# Patient Record
Sex: Male | Born: 1980 | Race: Black or African American | Hispanic: No | Smoking: Never smoker
Health system: Southern US, Community
[De-identification: ages and names within clinical notes are randomized; demographics above are authoritative.]

## PROBLEM LIST (undated history)

## (undated) DIAGNOSIS — I10 Essential (primary) hypertension: Secondary | ICD-10-CM

## (undated) DIAGNOSIS — I4891 Unspecified atrial fibrillation: Secondary | ICD-10-CM

## (undated) HISTORY — DX: Essential (primary) hypertension: I10

## (undated) HISTORY — DX: Unspecified atrial fibrillation: I48.91

---
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
## 2001-12-18 ENCOUNTER — Encounter: Admission: RE | Admit: 2001-12-18 | Discharge: 2001-12-18 | Payer: Self-pay | Admitting: Family Medicine

## 2002-08-23 ENCOUNTER — Encounter: Admission: RE | Admit: 2002-08-23 | Discharge: 2002-08-23 | Payer: Self-pay | Admitting: Family Medicine

## 2002-08-23 ENCOUNTER — Encounter: Admission: RE | Admit: 2002-08-23 | Discharge: 2002-08-23 | Payer: Self-pay | Admitting: Sports Medicine

## 2002-08-23 ENCOUNTER — Encounter: Payer: Self-pay | Admitting: Sports Medicine

## 2004-06-26 ENCOUNTER — Emergency Department (HOSPITAL_COMMUNITY): Admission: EM | Admit: 2004-06-26 | Discharge: 2004-06-26 | Payer: Self-pay | Admitting: Family Medicine

## 2005-01-17 ENCOUNTER — Ambulatory Visit: Payer: Self-pay | Admitting: Family Medicine

## 2007-06-19 ENCOUNTER — Ambulatory Visit: Payer: Self-pay | Admitting: Family Medicine

## 2007-06-19 ENCOUNTER — Encounter: Payer: Self-pay | Admitting: *Deleted

## 2007-06-21 ENCOUNTER — Ambulatory Visit: Payer: Self-pay | Admitting: Sports Medicine

## 2007-11-11 ENCOUNTER — Emergency Department (HOSPITAL_COMMUNITY): Admission: EM | Admit: 2007-11-11 | Discharge: 2007-11-11 | Payer: Self-pay | Admitting: Family Medicine

## 2007-11-14 ENCOUNTER — Ambulatory Visit: Payer: Self-pay | Admitting: Family Medicine

## 2007-11-14 ENCOUNTER — Encounter (INDEPENDENT_AMBULATORY_CARE_PROVIDER_SITE_OTHER): Payer: Self-pay | Admitting: Family Medicine

## 2007-11-14 DIAGNOSIS — L988 Other specified disorders of the skin and subcutaneous tissue: Secondary | ICD-10-CM | POA: Insufficient documentation

## 2007-11-16 ENCOUNTER — Encounter: Payer: Self-pay | Admitting: *Deleted

## 2008-01-04 ENCOUNTER — Ambulatory Visit: Payer: Self-pay | Admitting: Family Medicine

## 2008-01-11 ENCOUNTER — Telehealth: Payer: Self-pay | Admitting: *Deleted

## 2008-02-17 ENCOUNTER — Emergency Department (HOSPITAL_COMMUNITY): Admission: EM | Admit: 2008-02-17 | Discharge: 2008-02-17 | Payer: Self-pay | Admitting: Emergency Medicine

## 2008-06-18 ENCOUNTER — Ambulatory Visit: Payer: Self-pay | Admitting: Family Medicine

## 2008-06-20 ENCOUNTER — Ambulatory Visit: Payer: Self-pay | Admitting: Family Medicine

## 2009-03-31 ENCOUNTER — Encounter: Payer: Self-pay | Admitting: Family Medicine

## 2009-03-31 ENCOUNTER — Inpatient Hospital Stay (HOSPITAL_COMMUNITY): Admission: EM | Admit: 2009-03-31 | Discharge: 2009-04-01 | Payer: Self-pay | Admitting: Emergency Medicine

## 2009-03-31 ENCOUNTER — Ambulatory Visit: Payer: Self-pay | Admitting: Family Medicine

## 2009-04-01 ENCOUNTER — Encounter: Payer: Self-pay | Admitting: Family Medicine

## 2009-04-14 ENCOUNTER — Ambulatory Visit: Payer: Self-pay | Admitting: Family Medicine

## 2009-04-14 DIAGNOSIS — I4891 Unspecified atrial fibrillation: Secondary | ICD-10-CM

## 2009-04-21 ENCOUNTER — Telehealth: Payer: Self-pay | Admitting: Family Medicine

## 2009-04-21 ENCOUNTER — Emergency Department (HOSPITAL_COMMUNITY): Admission: EM | Admit: 2009-04-21 | Discharge: 2009-04-21 | Payer: Self-pay | Admitting: *Deleted

## 2009-05-07 ENCOUNTER — Ambulatory Visit: Payer: Self-pay | Admitting: Family Medicine

## 2009-05-07 ENCOUNTER — Ambulatory Visit (HOSPITAL_COMMUNITY): Admission: RE | Admit: 2009-05-07 | Discharge: 2009-05-07 | Payer: Self-pay | Admitting: Family Medicine

## 2009-05-07 DIAGNOSIS — Z8679 Personal history of other diseases of the circulatory system: Secondary | ICD-10-CM | POA: Insufficient documentation

## 2009-05-07 DIAGNOSIS — I10 Essential (primary) hypertension: Secondary | ICD-10-CM

## 2009-05-07 DIAGNOSIS — G510 Bell's palsy: Secondary | ICD-10-CM | POA: Insufficient documentation

## 2009-05-08 ENCOUNTER — Encounter: Payer: Self-pay | Admitting: Family Medicine

## 2009-05-21 ENCOUNTER — Ambulatory Visit: Payer: Self-pay | Admitting: Family Medicine

## 2009-05-22 ENCOUNTER — Telehealth: Payer: Self-pay | Admitting: Family Medicine

## 2009-06-15 ENCOUNTER — Encounter: Payer: Self-pay | Admitting: Family Medicine

## 2009-10-21 ENCOUNTER — Encounter: Payer: Self-pay | Admitting: Family Medicine

## 2009-10-26 ENCOUNTER — Ambulatory Visit: Payer: Self-pay | Admitting: Family Medicine

## 2009-10-28 ENCOUNTER — Ambulatory Visit: Payer: Self-pay | Admitting: Family Medicine

## 2009-11-12 ENCOUNTER — Ambulatory Visit: Payer: Self-pay | Admitting: Family Medicine

## 2009-11-12 DIAGNOSIS — E669 Obesity, unspecified: Secondary | ICD-10-CM

## 2009-11-16 ENCOUNTER — Ambulatory Visit: Payer: Self-pay | Admitting: Family Medicine

## 2009-11-16 ENCOUNTER — Encounter: Payer: Self-pay | Admitting: Family Medicine

## 2009-11-16 LAB — CONVERTED CEMR LAB
ALT: 32 units/L (ref 0–53)
AST: 27 units/L (ref 0–37)
Albumin: 4.7 g/dL (ref 3.5–5.2)
Alkaline Phosphatase: 39 units/L (ref 39–117)
BUN: 10 mg/dL (ref 6–23)
CO2: 26 meq/L (ref 19–32)
Calcium: 9.3 mg/dL (ref 8.4–10.5)
Chlamydia, Swab/Urine, PCR: POSITIVE — AB
Chloride: 102 meq/L (ref 96–112)
Cholesterol: 201 mg/dL — ABNORMAL HIGH (ref 0–200)
Creatinine, Ser: 0.96 mg/dL (ref 0.40–1.50)
GC Probe Amp, Urine: NEGATIVE
Glucose, Bld: 92 mg/dL (ref 70–99)
HCT: 47.4 % (ref 39.0–52.0)
HCV Ab: NEGATIVE
HDL: 32 mg/dL — ABNORMAL LOW (ref >39)
Hemoglobin: 15.3 g/dL (ref 13.0–17.0)
Hep B S Ab: NEGATIVE
Hepatitis B Surface Ag: NEGATIVE
LDL Cholesterol: 121 mg/dL — ABNORMAL HIGH (ref 0–99)
MCHC: 32.3 g/dL (ref 30.0–36.0)
MCV: 85.4 fL (ref 78.0–100.0)
Platelets: 224 10*3/uL (ref 150–400)
Potassium: 4.1 meq/L (ref 3.5–5.3)
RBC: 5.55 M/uL (ref 4.22–5.81)
RDW: 13 % (ref 11.5–15.5)
Sodium: 139 meq/L (ref 135–145)
Total Bilirubin: 0.6 mg/dL (ref 0.3–1.2)
Total CHOL/HDL Ratio: 6.3
Total Protein: 7.5 g/dL (ref 6.0–8.3)
Triglycerides: 241 mg/dL — ABNORMAL HIGH (ref <150)
VLDL: 48 mg/dL — ABNORMAL HIGH (ref 0–40)
WBC: 5.4 10*3/uL (ref 4.0–10.5)

## 2009-11-19 ENCOUNTER — Telehealth: Payer: Self-pay | Admitting: Family Medicine

## 2009-11-19 ENCOUNTER — Encounter: Payer: Self-pay | Admitting: Family Medicine

## 2009-11-19 ENCOUNTER — Telehealth: Payer: Self-pay | Admitting: *Deleted

## 2009-11-20 ENCOUNTER — Encounter: Payer: Self-pay | Admitting: *Deleted

## 2009-11-24 ENCOUNTER — Ambulatory Visit: Payer: Self-pay | Admitting: Family Medicine

## 2009-12-08 ENCOUNTER — Ambulatory Visit: Payer: Self-pay | Admitting: Family Medicine

## 2009-12-08 ENCOUNTER — Ambulatory Visit (HOSPITAL_COMMUNITY): Admission: RE | Admit: 2009-12-08 | Discharge: 2009-12-08 | Payer: Self-pay | Admitting: Family Medicine

## 2009-12-15 ENCOUNTER — Encounter: Payer: Self-pay | Admitting: Family Medicine

## 2009-12-16 ENCOUNTER — Encounter: Payer: Self-pay | Admitting: Family Medicine

## 2010-01-26 ENCOUNTER — Ambulatory Visit: Payer: Self-pay | Admitting: Family Medicine

## 2010-02-18 ENCOUNTER — Ambulatory Visit: Payer: Self-pay | Admitting: Family Medicine

## 2010-02-25 ENCOUNTER — Encounter: Payer: Self-pay | Admitting: *Deleted

## 2010-03-10 ENCOUNTER — Ambulatory Visit: Payer: Self-pay | Admitting: Cardiovascular Disease

## 2010-03-10 ENCOUNTER — Encounter: Payer: Self-pay | Admitting: Family Medicine

## 2010-05-11 NOTE — Letter (Signed)
Summary: Generic Letter  [facility]  [address]   [place], [place] [address]   Phone: [phone]  Fax: [phone]    11/20/2009  Keith Briggs [address] [place], [place]  [address]  Dear Keith Briggs,  I have tried to contact you by phone regarding your test results.  You have tested postive for Chlamydia. Please call and make an appointment to see a nurse so that you can be treated.  Thank you.         Sincerely,   Loralee Pacas CMA  Appended Document: Generic Letter Letter was not mailed (ERROR) faxed pos test results to [facility] left many msg with pt with no return call

## 2010-05-11 NOTE — Letter (Signed)
Summary: Lipid Letter  [facility]  [address]   [place], [place] [address]   Phone: [phone]  Fax: [phone]    11/19/2009  Makenzie Vittorio [address] [place], [place]  [address]  Dear Algis Liming:  We have carefully reviewed your last lipid profile from 11/16/2009 and the results are noted below with a summary of recommendations for lipid management.    Cholesterol:       201     Goal: <200   HDL "good" Cholesterol:   32     Goal: >40   LDL "bad" Cholesterol:   121     Goal: <160   Triglycerides:       241     Goal: <200   Intensify weight management, increase physical activity, and  smoking cessation.  These are the most important first steps to take.  Return in 3 months or sooner so we can discuss lifestyle changes and when and if medicine is right for you.  Your test for Chlamydia is positive.  Please call the office to come in for treatment.  Do not have sex until 7 days after treated and please inform any sexual partners to get tested.    TLC Diet (Therapeutic Lifestyle Change): Saturated Fats & Transfatty acids should be kept < 7% of total calories ***Reduce Saturated Fats Polyunstaurated Fat can be up to 10% of total calories Monounsaturated Fat Fat can be up to 20% of total calories Total Fat should be no greater than 25-35% of total calories Carbohydrates should be 50-60% of total calories Protein should be approximately 15% of total calories Fiber should be at least 20-30 grams a day ***Increased fiber may help lower LDL Total Cholesterol should be < 200mg /day Consider adding plant stanol/sterols to diet (example: Benacol spread) ***A higher intake of unsaturated fat may reduce Triglycerides and Increase HDL    Adjunctive Measures (may lower LIPIDS and reduce risk of Heart Attack) include: Aerobic Exercise (20-30 minutes 3-4 times a week) Limit Alcohol Consumption Weight Reduction Aspirin 75-81 mg a day by mouth (if not allergic or  contraindicated) Dietary Fiber 20-30 grams a day by mouth     Current Medications: 1)    Bayer Aspirin 325 Mg Tabs (Aspirin) .Marland Kitchen.. 1 tab by mouth daily 2)    Toprol Xl 100 Mg Xr24h-tab (Metoprolol succinate) .Marland Kitchen.. 1 tab by mouth daily for blood pressure and heart rhythm 3)    Hydrochlorothiazide 12.5 Mg Caps (Hydrochlorothiazide) .Marland Kitchen.. 1 tab by mouth daily for blood pressure  If you have any questions, please call. We appreciate being able to work with you.   Sincerely,    [facility] Delbert Harness MD  Appended Document: Lipid Letter MAILED LETTER TO PT

## 2010-05-11 NOTE — Miscellaneous (Signed)
Summary: Re:No urology referral.  Clinical Lists Changes   received notification from  [facility] that they are unable to complete the urology referral at this time due to lack of volunteer physicians is this speciality group. they will notify patient when they can process referral. Theresia Lo RN  December 16, 2009 2:29 PM

## 2010-05-11 NOTE — Consult Note (Signed)
Summary: [facility]  [facility]   Imported By: De Nurse 03/18/2010 16:07:28  _____________________________________________________________________  External Attachment:    Type:   Image     Comment:   External Document

## 2010-05-11 NOTE — Progress Notes (Signed)
  Phone Note Outgoing Call   Call placed by: Loralee Pacas CMA,  November 19, 2009 4:59 PM Call placed to: Patient Summary of Call: lvm for pt to call back Initial call taken by: Loralee Pacas CMA,  November 19, 2009 4:59 PM  Follow-up for Phone Call        lvm for pt to return call Follow-up by: Loralee Pacas CMA,  November 20, 2009 11:02 AM    sent letter to pt concerning results and the need for tx.Loralee Pacas CMA  November 20, 2009 11:42 AM
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
## 2010-05-11 NOTE — Assessment & Plan Note (Signed)
Summary: f/u/eo   Vital Signs:  Patient profile:   30 year old male Weight:      238.5 pounds Temp:     98.9 degrees F oral Pulse rate:   62 / minute Pulse rhythm:   regular BP sitting:   138 / 89  (left arm) Cuff size:   regular  Vitals Entered By: Loralee Pacas CMA (November 12, 2009 11:04 AM)  Primary Care Provider:  Delbert Harness MD   History of Present Illness: 30 yo here for follow-up.  HYPERTENSION Meds: Taking and tolerating? toprol xl 100 and hctz 25 Home BP's: no Chest Pain: no Dyspnea: no  a. fib:  no palpitations, lightheadedness, sob, syncope.  Has been taking meds as directed.  Reveiwed hospital discharge.  Obesity:  Is not doing any exercise or dietary modification.  Says he has increased physical activity in the last week and plans on walking more regularly.  Current Medications (verified): 1)  Bayer Aspirin 325 Mg Tabs (Aspirin) .Marland Kitchen.. 1 Tab By Mouth Daily 2)  Toprol Xl 100 Mg Xr24h-Tab (Metoprolol Succinate) .Marland Kitchen.. 1 Tab By Mouth Daily For Blood Pressure and Heart Rhythm 3)  Hydrochlorothiazide 12.5 Mg Caps (Hydrochlorothiazide) .Marland Kitchen.. 1 Tab By Mouth Daily For Blood Pressure  Allergies: 1)  ! * Bee Stings 2)  * Butter Milk PMH-FH-SH reviewed-no changes except otherwise noted  Family History: Bone CA - mat aunt, Diabetes - pat grandfather, Skin CA - mat aunt, rare ovarian CA in mother, Thyroid CA- mat grandmother Family History Hypertension Family History Ovarian cancer GRandma with DM  Review of Systems      See HPI  Physical Exam  General:  Well-developed,well-nourished,in no acute distress; alert,appropriate and cooperative throughout examination Eyes:  vision grossly intact, pupils equal, pupils round, and pupils reactive to light.   Neck:  supple, no masses, and no JVD.   Lungs:  Normal respiratory effort, chest expands symmetrically. Lungs are clear to auscultation, no crackles or wheezes. Heart:  Normal rate and regular rhythm. S1 and S2 normal  without gallop, murmur, click, rub or other extra sounds. Extremities:  no le edema Neurologic:  alert & oriented X3.   Psych:  Oriented X3, memory intact for recent and remote, normally interactive, good eye contact, not anxious appearing, and not depressed appearing.     Impression & Recommendations:  Problem # 1:  HYPERTENSION, BENIGN ESSENTIAL (ICD-401.1)  Borderline today.  patient agreeable to checking BP's at home periodically and to return if above 140/90.  Will copntinue current meds.  His updated medication list for this problem includes:    Toprol Xl 100 Mg Xr24h-tab (Metoprolol succinate) .Marland Kitchen... 1 tab by mouth daily for blood pressure and heart rhythm    Hydrochlorothiazide 12.5 Mg Caps (Hydrochlorothiazide) .Marland Kitchen... 1 tab by mouth daily for blood pressure  Orders: [facility]- Est  Level 4 (99214)Future Orders: CBC-[facility] ([id number]) ... 11/16/2009 Comp Met-[facility] ([id number]) ... 11/16/2009 Lipid-[facility] ([id number]) ... 11/16/2009  BP today: 138/89 Prior BP: 140/72 (05/07/2009)  Problem # 2:  ATRIAL FIBRILLATION, PAROXYSMAL (ICD-427.31) Continue rate control.  CHADS2 score of 1.  No coumadin.  His updated medication list for this problem includes:    Bayer Aspirin 325 Mg Tabs (Aspirin) .Marland Kitchen... 1 tab by mouth daily    Toprol Xl 100 Mg Xr24h-tab (Metoprolol succinate) .Marland Kitchen... 1 tab by mouth daily for blood pressure and heart rhythm  Orders: [facility]- Est  Level 4 (99214)Future Orders: CBC-[facility] (57846) ... 11/16/2009 Comp Met-[facility] ([id number]) ... 11/16/2009  Problem # 3:  OBESITY, UNSPECIFIED (ICD-278.00) Patient would like to make changes in diet.  He expressed interest in nutritional consult today.  I gave him diethistory and 3 day diet log to bring to consult with Dr. Gerilyn Pilgrim for obesity and hypertension.  Discussed BMI and its role on health.  Will f/u at next visit  Orders: [facility]- Est  Level 4 (99214)Future Orders: CBC-[facility] (19147) ... 11/16/2009 Lipid-[facility] ([id number]) ...  11/16/2009  Problem # 4:  SEXUALLY TRANSMITTED DISEASE, EXPOSURE TO (ICD-V01.6) patient desires screening for STD's/  Orders: [facility]- Est  Level 4 (99214)Future Orders: HIV-[facility] ([id number]) ... 11/16/2009 Hep C Ab-[facility] ([id number]) ... 11/16/2009 Hep Bs Ab-[facility] ([id number]) ... 11/16/2009 GC/Chlamydia-[facility] ([id number]) ... 11/16/2009 Hep Bs Ag-[facility] ([id number]) ... 11/16/2009 RPR-[facility] [id number]) ... 11/16/2009  Complete Medication List: 1)  Bayer Aspirin 325 Mg Tabs (Aspirin) .Marland Kitchen.. 1 tab by mouth daily 2)  Toprol Xl 100 Mg Xr24h-tab (Metoprolol succinate) .Marland Kitchen.. 1 tab by mouth daily for blood pressure and heart rhythm 3)  Hydrochlorothiazide 12.5 Mg Caps (Hydrochlorothiazide) .Marland Kitchen.. 1 tab by mouth daily for blood pressure  Patient Instructions: 1)  Make lab appointment for fasting labs- no food or drink in 8 hours 2)  Will check cholesterol, fasting blood sugar, HIV, syphillis, gonorrhea, chlamydia 3)  Make appt to see Dr. Gerilyn Pilgrim for nutrition counseling 4)  Follow-up in 3 months or sooner if needed.   Prevention & Chronic Care Immunizations   Influenza vaccine: Fluvax Non-MCR  (01/04/2008)    Tetanus booster: 01/14/2005: Done.    Pneumococcal vaccine: Not documented  Other Screening   Smoking status: never  (03/31/2009)  Hypertension   Last Blood Pressure: 138 / 89  (11/12/2009)   Serum creatinine: Not documented   Serum potassium Not documented CMP ordered     Hypertension flowsheet reviewed?: Yes   Progress toward BP goal: At goal  Self-Management Support :   Personal Goals (by the next clinic visit) :      Personal blood pressure goal: 140/90  (11/12/2009)   Patient will work on the following items until the next clinic visit to reach self-care goals:     Medications and monitoring: take my medicines every day, check my blood pressure  (11/12/2009)    Hypertension self-management support: Pre-printed educational material  (11/12/2009)

## 2010-05-11 NOTE — Miscellaneous (Signed)
Summary: [facility]  ROI   Imported By: De Nurse 12/11/2009 11:46:20  _____________________________________________________________________  External Attachment:    Type:   Image     Comment:   External Document

## 2010-05-11 NOTE — Progress Notes (Signed)
Summary: Rx Ques  Phone Note Call from Patient Call back at [facility] Phone [phone]   Caller: Patient Summary of Call: Pt calling back about new rx for bp. Initial call taken by: Clydell Hakim,  May 22, 2009 4:41 PM  Follow-up for Phone Call        will start hctz.  asked pt to call for nurse visit for bp check and lab visit in 1-2 weeks to check K+.  pt expressed understanding.  Follow-up by: Asher Muir MD,  May 22, 2009 5:03 PM

## 2010-05-11 NOTE — Letter (Signed)
Summary: Generic Letter  [facility]  [address]   [place], [place] [address]   Phone: [phone]  Fax: [phone]    02/25/2010  ADEN SEK [address] [place], [place]  [address]  Dear Mr. Bruster,  I have scheduled an appointment for you on March 10, 2010 at 1130am with Dr. Elease Hashimoto at [facility]. The office is located at [address] [place] Ph. [phone].  If you cannot keep this appointment please call thier office at least 24 hours  in advance to cancel or reschedule.   Sincerely,   Loralee Pacas CMA

## 2010-05-11 NOTE — Assessment & Plan Note (Signed)
Summary: FLU SHOT/[facility]  Nurse Visit   Vital Signs:  Patient profile:   30 year old male Temp:     98.2 degrees F  Vitals Entered By: Theresia Lo RN (January 26, 2010 4:10 PM)  Allergies: 1)  ! * Bee Stings 2)  * Butter Milk  Immunizations Administered:  Influenza Vaccine # 1:    Vaccine Type: Fluvax 3+    Site: right deltoid    Mfr: GlaxoSmithKline    Dose: 0.5 ml    Route: IM    Given by: Theresia Lo RN    Exp. Date: 10/06/2010    Lot #: [id number]    VIS given: 11/03/09 version given January 26, 2010.  Orders Added: 1)  Flu Vaccine 25yrs + [90658] 2)  Admin 1st Vaccine [90471]  Appended Document: FLU SHOT/[facility]   Flu Vaccine Consent Questions    Do you have a history of severe allergic reactions to this vaccine? no    Any prior history of allergic reactions to egg and/or gelatin? no    Do you have a sensitivity to the preservative Thimersol? no    Do you have a past history of Guillan-Barre Syndrome? no    Do you currently have an acute febrile illness? no    Have you ever had a severe reaction to latex? no    Vaccine information given and explained to patient? yes

## 2010-05-11 NOTE — Assessment & Plan Note (Signed)
Summary: Chest pain/tlb   Vital Signs:  Patient profile:   30 year old male Weight:      239.6 pounds Temp:     98.8 degrees F oral Pulse rate:   62 / minute Pulse rhythm:   regular BP sitting:   126 / 81  (right arm) Cuff size:   large  Vitals Entered By: Loralee Pacas CMA (December 08, 2009 2:19 PM) CC: wart removal   Primary Care Provider:  Delbert Harness MD  CC:  wart removal.  History of Present Illness: 30 yo with history of paroxysmal A. fib here for appt for wart.  His mom is concerned about recurrent episodes on chest pain.  4 months duration.  Described as sharp "like a pin is poking me" and usually occurs on exertion but can also occur at rest.  Associated with some SOB but no nausea or diaphoresis.  pain does not radiate.  Also can feel fast irregular heartbeat at times but not always associated with episodes of chest pain.  States it is hard to quantify severity of pain as he is used to "dealing with pain"  Non smoker, no fam history of MI,CVA.  Habits & Providers  Alcohol-Tobacco-Diet     Alcohol drinks/day: 0     Tobacco Status: never  Allergies: 1)  ! * Bee Stings 2)  * Butter Milk PMH-FH-SH reviewed for relevance  Review of Systems      See HPI  Physical Exam  General:  Well-developed,well-nourished,in no acute distress; alert,appropriate and cooperative throughout examination Lungs:  Normal respiratory effort, chest expands symmetrically. Lungs are clear to auscultation, no crackles or wheezes. Heart:  Normal rate and regular rhythm. S1 and S2 normal without gallop, murmur, click, rub or other extra sounds. Additional Exam:  EKG: rate 56, irr irr   Impression & Recommendations:  Problem # 1:  CHEST PAIN, ATYPICAL (ICD-786.59)  Concern for chest pain and SOB limiting exercise tolerance in otherwise healthy young man who is active. May benefit from decreased BB due to today's bradycardia but he also endorses episodes of tachycardia.   WIl send for  exercise stress test: discussed with Dr. Jennette Kettle.  Goal is to possibly reproduce and characterize symptoms with exercise and monitor rhythm at that time.  Wil continue Bb at current dose for now.  Orders: ETT (ETT)  Problem # 2:  FAMILY PLANNING (ICD-V25.09)  patient strongly desires permanent sterility.  Has no children.  He states he has done a great deal of thinking and reading and would like urology referral.  Will place referral so patient may discuss pocedure further with urology.    Orders: Urology Referral (Urology)  Complete Medication List: 1)  Bayer Aspirin 325 Mg Tabs (Aspirin) .Marland Kitchen.. 1 tab by mouth daily 2)  Toprol Xl 100 Mg Xr24h-tab (Metoprolol succinate) .Marland Kitchen.. 1 tab by mouth daily for blood pressure and heart rhythm 3)  Hydrochlorothiazide 12.5 Mg Caps (Hydrochlorothiazide) .Marland Kitchen.. 1 tab by mouth daily for blood pressure  Other Orders: EKG- [facility] (EKG)  Patient Instructions: 1)  Will call you with appointment for stress testing for your heart 2)  Will make referral for urology to talk about vasectomy. 3)  Will follow-up with your finger at next appointment. 4)  make follow-up after your stress test appointment.  Appended Document: Chest pain/tlb    Clinical Lists Changes  Orders: Added new Test order of [facility]- Est  Level 4 (76160) - Signed      Appended Document: Orders Update  Clinical Lists Changes  Orders: Added new Referral order of Cardiology Referral (Cardiology) - Signed

## 2010-05-11 NOTE — Progress Notes (Signed)
  Phone Note Outgoing Call   Summary of Call: Attempted to call patient to let him know positive chlamydia results.  Will need to come in for azithromycine slurry 1 gm x 1.  If he calls back please inform him-I will send him a letter.  Please attempt to call patient again.  Thanks Initial call taken by: Delbert Harness MD,  November 19, 2009 12:29 PM     Appended Document:  attempted to call pt lvm for pt to rtn call

## 2010-05-11 NOTE — Assessment & Plan Note (Signed)
Summary: FU BP/KH   Vital Signs:  Patient profile:   30 year old male Weight:      235.9 pounds Temp:     98.2 degrees F oral Pulse rate:   72 / minute Pulse rhythm:   regular BP sitting:   140 / 72 Cuff size:   large  Vitals Entered By: Loralee Pacas CMA (May 07, 2009 10:44 AM)  Primary Care Provider:  Asher Muir MD  CC:  afib, htn, and bells palsy.  History of Present Illness: 1.  afib--had one episode of fast heart rate since last visit.  occurred during exercise and resolved spontaneously after a few mintues.   tolerating metopolol well.    2.  htn--140/72 today, but states has been in 160s at home several times.    3.  bells palsy--had episode of face drooping several weeks ago.  went to ER.  diagnosed with bells palsy.  symptoms are almost resolved.  has mild assymettry when he smiles, but otherwise back to normal.    Current Medications (verified): 1)  Bayer Aspirin 325 Mg Tabs (Aspirin) .Marland Kitchen.. 1 Tab By Mouth Daily 2)  Metoprolol Succinate 50 Mg Xr24h-Tab (Metoprolol Succinate) .Marland Kitchen.. 1 Tab By Mouth Daily For Irregular Heart Beat  Allergies: 1)  ! * Bee Stings 2)  * Butter Milk  Physical Exam  General:  Well-developed,well-nourished,in no acute distress; alert,appropriate and cooperative throughout examination Lungs:  Normal respiratory effort, chest expands symmetrically. Lungs are clear to auscultation, no crackles or wheezes. Heart:  Normal rate and regular rhythm. S1 and S2 normal without gallop, murmur, click, rub or other extra sounds. Additional Exam:  vital signs reviewed    Impression & Recommendations:  Problem # 1:  ATRIAL FIBRILLATION, PAROXYSMAL (ICD-427.31) Assessment Unchanged EKG today shows normal sinus rhythm.  but still with periodic episodes.  increase metop to 100.  rtc for nurse visit in 2 weeks for bp and pulse check His updated medication list for this problem includes:    Bayer Aspirin 325 Mg Tabs (Aspirin) .Marland Kitchen... 1 tab by mouth  daily    Metoprolol Succinate 50 Mg Xr24h-tab (Metoprolol succinate) .Marland Kitchen... 1 tab by mouth daily for irregular heart beat    Toprol Xl 100 Mg Xr24h-tab (Metoprolol succinate) .Marland Kitchen... 1 tab by mouth daily for blood pressure and heart rhythm  Orders: 12 Lead EKG (12 Lead EKG) [facility]- Est  Level 4 (52841)  Problem # 2:  HYPERTENSION, BENIGN ESSENTIAL (ICD-401.1) Assessment: Unchanged  increase metop His updated medication list for this problem includes:    Metoprolol Succinate 50 Mg Xr24h-tab (Metoprolol succinate) .Marland Kitchen... 1 tab by mouth daily for irregular heart beat    Toprol Xl 100 Mg Xr24h-tab (Metoprolol succinate) .Marland Kitchen... 1 tab by mouth daily for blood pressure and heart rhythm  Orders: [facility]- Est  Level 4 (99214)  Problem # 3:  BELL'S PALSY (ICD-351.0) Assessment: Improved  improving.  he plans to follow up with neuro only if his symptoms don't resolve.  I agree that is fine  Orders: [facility]- Est  Level 4 (32440)  Complete Medication List: 1)  Bayer Aspirin 325 Mg Tabs (Aspirin) .Marland Kitchen.. 1 tab by mouth daily 2)  Metoprolol Succinate 50 Mg Xr24h-tab (Metoprolol succinate) .Marland Kitchen.. 1 tab by mouth daily for irregular heart beat 3)  Toprol Xl 100 Mg Xr24h-tab (Metoprolol succinate) .Marland Kitchen.. 1 tab by mouth daily for blood pressure and heart rhythm  Patient Instructions: 1)  It was nice to see you today. 2)  Increase your metoprolol to  100mg  every day.  You can take 2 of your old prescription daily until it runs out.  Then start your new prescription. 3)  Please schedule a nurse visit t in 2 weeks for your blood pressure and pulse.  4)  Please schedule a follow-up appointment in 6 months.  Come in sooner if you are still having episodes of heart racing.    Prescriptions: TOPROL XL 100 MG XR24H-TAB (METOPROLOL SUCCINATE) 1 tab by mouth daily for blood pressure and heart rhythm  #30 x 6   Entered and Authorized by:   Asher Muir MD   Signed by:   Asher Muir MD on 05/07/2009   Method used:    Electronically to        [facility] (retail)       [address]       [place], [place]  [address]       Ph: [phone]       Fax: [phone]   RxID:   [id number]

## 2010-05-11 NOTE — Miscellaneous (Signed)
Summary: ref pos STD  Clinical Lists Changes    unable to reach pt. pt not treated results faxed to [facility].Loralee Pacas CMA  November 20, 2009 11:46 AM (DID NOT MAIL LETTER TO PT)

## 2010-05-11 NOTE — Miscellaneous (Signed)
Summary: Refill  Clinical Lists Changes Pt need refill on his Metoprolol.  Currently getting filled at [facility], was told by Rudell Cobb to get a rx to give to the [facility] pharmacy.  Pt is out of meds presently.  Is here in front office area to receive rx. Abundio Miu  June 15, 2009 4:05 PM    called [facility] and cancelled future refills . patient had gotten it filled there one time. called [facility] pharmacy and gave verbal refill for Toprol XL 100 mg one tablet daily # 30 tabs with 4 additional refills. Morty Ortwein RN  June 15, 2009 4:24 PM

## 2010-05-11 NOTE — Assessment & Plan Note (Signed)
Summary: bp check/eo  Nurse Visit BP checked manually using large adult cuff. patient has been taking all meds as directed. BP LA 136/80 RA 140/74 pulse 68. advised will forward message to MD and if she has any other instructions will call him back , otherwise continue all meds and follow up as planned in 6 months. Kenwood Rosiak RN  May 21, 2009 10:12 AM  Left mssg for pt to call back.  Plan to add hctz.  see subsequent phone note    Allergies: 1)  ! * Bee Stings 2)  * Butter Milk  Orders Added: 1)  No Charge Patient Arrived (NCPA0) [NCPA0] Prescriptions: HYDROCHLOROTHIAZIDE 12.5 MG CAPS (HYDROCHLOROTHIAZIDE) 1 tab by mouth daily for blood pressure  #31 x 6   Entered and Authorized by:   Asher Muir MD   Signed by:   Asher Muir MD on 05/21/2009   Method used:   Print then Give to Patient   RxID:   [id number]

## 2010-05-11 NOTE — Assessment & Plan Note (Signed)
Summary: hfu,df   Vital Signs:  Patient profile:   30 year old male Weight:      233.2 pounds Temp:     98.7 degrees F oral Pulse rate:   82 / minute Pulse rhythm:   regular BP sitting:   124 / 87  (left arm) Cuff size:   large  Vitals Entered By: Loralee Pacas CMA (April 14, 2009 3:45 PM)  Primary Care Provider:  Asher Muir MD  CC:  follow up hospitalization for afib.  History of Present Illness: 1.  afib--hospitalized for afib in late december(I believe with RVR; although not certain rate after reading records).  placed on dilt drip.  converted to NSR.  had normal echo.  sent home on metop XL 25 and full dose asa.  since d/c, has had one episode of feeling palpiations.  no chest pain or SOB at that time.  the episode resolved on its own.    Current Medications (verified): 1)  Metoprolol Succinate 25 Mg Xr24h-Tab (Metoprolol Succinate) .Marland Kitchen.. 1 Tab By Mouth Daily 2)  Bayer Aspirin 325 Mg Tabs (Aspirin) .Marland Kitchen.. 1 Tab By Mouth Daily  Allergies: 1)  ! * Bee Stings 2)  * Butter Milk  Past History:  Past Medical History: h/o chlamydia, 5/99, 10/00, 10/06 - txed occasional episodes of heart palpitations (and one episode with syncope in 2002)  paroxysmal afib--diagnosed 12/10  Past Surgical History: Reviewed history from 03/31/2009 and no changes required. Wisdom teeth removed.  Family History: Reviewed history from 03/31/2009 and no changes required. Bone CA - mat aunt, Diabetes - pat grandfather, Skin CA - mat aunt, rare ovarian CA in mother, Thyroid CA- mat grandmother Family History Hypertension Family History Ovarian cancer  Social History: lives alone.  Single. Multiple sexual partners. Attends [employer]) . Works at [employer]. No tobacco. Occ. no etoh. No IV drug use.  Physical Exam  General:  Well-developed,well-nourished,in no acute distress; alert,appropriate and cooperative throughout examination Lungs:  Normal respiratory effort,  chest expands symmetrically. Lungs are clear to auscultation, no crackles or wheezes. Heart:  Normal rate and regular rhythm. S1 and S2 normal without gallop, murmur, click, rub or other extra sounds. Extremities:  no le edema Additional Exam:  vital signs reviewed    Impression & Recommendations:  Problem # 1:  ATRIAL FIBRILLATION, PAROXYSMAL (ICD-427.31)  chads score of zero.  increase metop; pulse and bp should be able to tolerate increase.  Would like to have him with few-no episodes.   f/u in 3-4 weeks.  plan to check an EKG at that time.  consider holter monitor if having frequent episodes.  continue asa.  Cards said he could see them as needed.   His updated medication list for this problem includes:    Bayer Aspirin 325 Mg Tabs (Aspirin) .Marland Kitchen... 1 tab by mouth daily    Metoprolol Succinate 50 Mg Xr24h-tab (Metoprolol succinate) .Marland Kitchen... 1 tab by mouth daily for irregular heart beat  Orders: [facility]- Est Level  3 (04540)  Complete Medication List: 1)  Bayer Aspirin 325 Mg Tabs (Aspirin) .Marland Kitchen.. 1 tab by mouth daily 2)  Metoprolol Succinate 50 Mg Xr24h-tab (Metoprolol succinate) .Marland Kitchen.. 1 tab by mouth daily for irregular heart beat  Patient Instructions: 1)  It was nice to see you today. 2)  For your irregular heartbeat, take 2 of your metoprolol tablets.  When those run out, start taking the new prescription I sent in for you (50mg  dose). 3)  Try to take your  blood pressure and pulse a few times a week. 4)  Please schedule a follow-up appointment in 3-4 weeks.  Prescriptions: METOPROLOL SUCCINATE 50 MG XR24H-TAB (METOPROLOL SUCCINATE) 1 tab by mouth daily for irregular heart beat  #30 x 6   Entered and Authorized by:   Asher Muir MD   Signed by:   Asher Muir MD on 04/14/2009   Method used:   Electronically to        [facility] (retail)       [address]       [place], [place]  [address]       Ph: [phone]       Fax: [phone]   RxID:    [id number]

## 2010-05-11 NOTE — Assessment & Plan Note (Signed)
Summary: TB test/eo  Nurse Visit   Allergies: 1)  ! * Bee Stings 2)  * Butter Milk  Immunizations Administered:  PPD Skin Test:    Vaccine Type: PPD    Site: right forearm    Mfr: Sanofi Pasteur    Dose: 0.1 ml    Route: ID    Given by: Theresia Lo RN    Exp. Date: 01/22/2011    Lot #: [id number]  Orders Added: 1)  TB Skin Test [86580] 2)  Admin 1st Vaccine [phone]

## 2010-05-11 NOTE — Assessment & Plan Note (Signed)
Summary: read tb/eo  Nurse Visit   Allergies: 1)  ! * Bee Stings 2)  * Butter Milk  PPD Results    Date of reading: 10/28/2009    Results: 11 X 7 mm    Interpretation: positive  Orders Added: 1)  No Charge Patient Arrived (NCPA0) [NCPA0]    Dr. 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  advised that patient needs to go to [facility] Dept to be seen and will get chest xray there . Called Tammy at [facility] and she advises for patient to go there now. Report of PPD  printed and given to patient to take to HD. Linc Renne RN  October 28, 2009 3:12 PM  Appended Document: read tb/eo received note from Marisa Sprinkles RN TB nurse supervisor at [facility] Dept that she read PPD at 5 mm  , negative, stating erythema only. Dr. Sheffield Slider notified.

## 2010-05-11 NOTE — Progress Notes (Signed)
Summary: triage  Phone Note Call from Patient Call back at Home Phone [phone]   Caller: Patient Summary of Call: pt states that the doctor increased his meds and now one side if his face is numb. Initial call taken by: De Nurse,  April 21, 2009 3:35 PM  Follow-up for Phone Call        bp is 171/80. took meds as ordered. states as of 2 days ago he cannot smile correctly or close his eye. this has persisted. told him to go to ED now. do not drive self. call EMS. he agreed with plan Follow-up by: Golden Circle RN,  April 21, 2009 3:40 PM

## 2010-05-11 NOTE — Assessment & Plan Note (Signed)
Summary: std tx,df  Nurse Visit   Allergies: 1)  ! * Bee Stings 2)  * Butter Milk  Medication Administration  Medication # 1:    Medication: Azithromycin oral    Diagnosis: CHLAMYDIAL INFECTION (ICD-099.41)    Dose:  1 gram    Route: po    Exp Date: 06/10/2010    Lot #: [id number]    Mfr: [email or web address]    Comments: patient advised to tell  partner to be treated, abstain form sex for 7 days ,always use condoms to prevent STD.    Patient tolerated medication without complications    Given by: Theresia Lo RN (November 24, 2009 11:13 AM)  Orders Added: 1)  Azithromycin oral [Q0144] 2)  Est Level 1- [facility] [85462]   Medication Administration  Medication # 1:    Medication: Azithromycin oral    Diagnosis: CHLAMYDIAL INFECTION (ICD-099.41)    Dose:  1 gram    Route: po    Exp Date: 06/10/2010    Lot #: [id number]    Mfr: greenstone    Comments: patient advised to tell  partner to be treated, abstain form sex for 7 days ,always use condoms to prevent STD.    Patient tolerated medication without complications    Given by: Theresia Lo RN (November 24, 2009 11:13 AM)  Orders Added: 1)  Azithromycin oral [Q0144] 2)  Est Level 1- [facility] [38182]  Appended Document: std tx,df Communicable Disease report faxed to [facility] Dept  notifying them that patient has been treated.

## 2010-05-20 ENCOUNTER — Other Ambulatory Visit: Payer: Self-pay | Admitting: Family Medicine

## 2010-05-20 DIAGNOSIS — I10 Essential (primary) hypertension: Secondary | ICD-10-CM

## 2010-05-20 MED ORDER — METOPROLOL SUCCINATE ER 50 MG PO TB24: 50.0000 mg | ORAL_TABLET | Freq: Every day | ORAL | Status: DC

## 2010-05-21 ENCOUNTER — Encounter: Payer: Self-pay | Admitting: Family Medicine

## 2010-05-21 ENCOUNTER — Ambulatory Visit (INDEPENDENT_AMBULATORY_CARE_PROVIDER_SITE_OTHER): Payer: Self-pay | Admitting: Family Medicine

## 2010-05-21 VITALS — BP 133/83 | HR 83 | Temp 98.2°F | Ht 71.0 in | Wt 233.6 lb

## 2010-05-21 DIAGNOSIS — T6391XA Toxic effect of contact with unspecified venomous animal, accidental (unintentional), initial encounter: Secondary | ICD-10-CM

## 2010-05-21 DIAGNOSIS — I10 Essential (primary) hypertension: Secondary | ICD-10-CM

## 2010-05-21 DIAGNOSIS — T63441A Toxic effect of venom of bees, accidental (unintentional), initial encounter: Secondary | ICD-10-CM

## 2010-05-21 DIAGNOSIS — I4891 Unspecified atrial fibrillation: Secondary | ICD-10-CM

## 2010-05-21 DIAGNOSIS — T63461A Toxic effect of venom of wasps, accidental (unintentional), initial encounter: Secondary | ICD-10-CM

## 2010-05-21 DIAGNOSIS — L988 Other specified disorders of the skin and subcutaneous tissue: Secondary | ICD-10-CM

## 2010-05-21 MED ORDER — METOPROLOL SUCCINATE ER 50 MG PO TB24: 100.0000 mg | ORAL_TABLET | Freq: Every day | ORAL | Status: DC

## 2010-05-21 MED ORDER — EPINEPHRINE 0.3 MG/0.3ML IJ DEVI: 0.3000 mg | Freq: Once | INTRAMUSCULAR | Status: AC

## 2010-05-21 MED ORDER — METOPROLOL SUCCINATE ER 100 MG PO TB24: 100.0000 mg | ORAL_TABLET | Freq: Every day | ORAL | Status: DC

## 2010-05-21 NOTE — Assessment & Plan Note (Signed)
Patient reports improvement in exercise tolerance with palpitations with 100 mg of toprol XL.  Will increase today as his blood pressure and pulse will tolerate.  He has plans to follow-up with cardiology next month to get results of cardiac monitoring.

## 2010-05-21 NOTE — Assessment & Plan Note (Signed)
Continue current meds 

## 2010-05-21 NOTE — Patient Instructions (Signed)
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 You are safe to drive your own vehicle. Please follow-up each year or sooner if needed.

## 2010-05-21 NOTE — Progress Notes (Signed)
  Subjective:    Patient ID: Keith Briggs, male    DOB: 1980-11-25, 30 y.o.   MRN: [id number]  HPI Hypertension/atrial fibrillation:  Wore heart monitor x 1 month.  Had episodes of atrial fibrillation which were exercise induced.  He states he increased his metoprolol and has not had reduced episodes of a.f bi- only twice in the past month when he forgot to take his medication.  Has not tried aerobic exercise again to see if he can tolerate it.  Has been weight lifting without problems.  No dyspnea, chest pain, edema.  Finger nodule: now has [employer] insurance, would like referral again which was postponed when he was uninsured.      Review of Systems neg as per HPI     Objective:   Physical Exam  Constitutional: He appears well-developed and well-nourished.  Neck: Neck supple.  Cardiovascular: Normal rate and regular rhythm.   No murmur heard. Pulmonary/Chest: Effort normal and breath sounds normal. No respiratory distress. He has no wheezes.          Assessment & Plan:

## 2010-05-21 NOTE — Assessment & Plan Note (Signed)
Given proximity over joint and previous recurrence, will refer to hand ortho to remove.

## 2010-05-24 ENCOUNTER — Ambulatory Visit (INDEPENDENT_AMBULATORY_CARE_PROVIDER_SITE_OTHER): Payer: Self-pay | Admitting: Family Medicine

## 2010-05-24 DIAGNOSIS — Z0000 Encounter for general adult medical examination without abnormal findings: Secondary | ICD-10-CM

## 2010-05-24 NOTE — Progress Notes (Signed)
Patient came in today for PPD. However it is noted in chart that he had last PPD 10/2009. He was referred to [facility]  because of positive PPD.  Advised patient that he does not need another PPD at this time. He needs to go to HD and ask them to give him follow up note about PPD status. He  voices understanding.

## 2010-05-31 ENCOUNTER — Ambulatory Visit (INDEPENDENT_AMBULATORY_CARE_PROVIDER_SITE_OTHER): Payer: Self-pay | Admitting: Cardiovascular Disease

## 2010-05-31 DIAGNOSIS — I4891 Unspecified atrial fibrillation: Secondary | ICD-10-CM

## 2010-06-10 ENCOUNTER — Encounter: Payer: Self-pay | Admitting: Cardiovascular Disease

## 2010-06-10 DIAGNOSIS — I4891 Unspecified atrial fibrillation: Secondary | ICD-10-CM | POA: Insufficient documentation

## 2010-06-10 DIAGNOSIS — R079 Chest pain, unspecified: Secondary | ICD-10-CM | POA: Insufficient documentation

## 2010-06-10 DIAGNOSIS — I10 Essential (primary) hypertension: Secondary | ICD-10-CM | POA: Insufficient documentation

## 2010-07-08 ENCOUNTER — Ambulatory Visit: Payer: Self-pay | Admitting: Cardiovascular Disease

## 2010-07-12 LAB — RAPID URINE DRUG SCREEN, HOSP PERFORMED
Benzodiazepines: NOT DETECTED
Cocaine: NOT DETECTED
Opiates: NOT DETECTED
Tetrahydrocannabinol: NOT DETECTED

## 2010-07-12 LAB — COMPREHENSIVE METABOLIC PANEL
ALT: 28 U/L (ref 0–53)
Albumin: 4 g/dL (ref 3.5–5.2)
Alkaline Phosphatase: 37 U/L — ABNORMAL LOW (ref 39–117)
BUN: 5 mg/dL — ABNORMAL LOW (ref 6–23)
Chloride: 101 mEq/L (ref 96–112)
Glucose, Bld: 101 mg/dL — ABNORMAL HIGH (ref 70–99)
Potassium: 3.9 mEq/L (ref 3.5–5.1)
Sodium: 139 mEq/L (ref 135–145)
Total Bilirubin: 1.2 mg/dL (ref 0.3–1.2)
Total Protein: 7.1 g/dL (ref 6.0–8.3)

## 2010-07-12 LAB — BASIC METABOLIC PANEL
Calcium: 9.5 mg/dL (ref 8.4–10.5)
Creatinine, Ser: 1.16 mg/dL (ref 0.4–1.5)
GFR calc Af Amer: >60 mL/min (ref >60)
GFR calc non Af Amer: >60 mL/min (ref >60)
Sodium: 135 mEq/L (ref 135–145)

## 2010-07-12 LAB — PROTIME-INR
INR: 1.05 (ref 0.00–1.49)
Prothrombin Time: 13.6 seconds (ref 11.6–15.2)

## 2010-07-12 LAB — CBC
HCT: 46 % (ref 39.0–52.0)
Hemoglobin: 15.4 g/dL (ref 13.0–17.0)
Hemoglobin: 16.8 g/dL (ref 13.0–17.0)
MCHC: 33.6 g/dL (ref 30.0–36.0)
RBC: 5.87 MIL/uL — ABNORMAL HIGH (ref 4.22–5.81)
WBC: 11.1 10*3/uL — ABNORMAL HIGH (ref 4.0–10.5)
WBC: 8.5 10*3/uL (ref 4.0–10.5)

## 2010-07-12 LAB — CARDIAC PANEL(CRET KIN+CKTOT+MB+TROPI)
CK, MB: 1.6 ng/mL (ref 0.3–4.0)
CK, MB: 1.7 ng/mL (ref 0.3–4.0)
Relative Index: 0.3 (ref 0.0–2.5)
Relative Index: 0.4 (ref 0.0–2.5)

## 2010-07-12 LAB — DIFFERENTIAL
Basophils Relative: 0 % (ref 0–1)
Lymphocytes Relative: 10 % — ABNORMAL LOW (ref 12–46)
Lymphs Abs: 1.1 10*3/uL (ref 0.7–4.0)
Monocytes Relative: 3 % (ref 3–12)
Neutro Abs: 9.7 10*3/uL — ABNORMAL HIGH (ref 1.7–7.7)
Neutrophils Relative %: 87 % — ABNORMAL HIGH (ref 43–77)

## 2010-07-23 ENCOUNTER — Encounter: Payer: Self-pay | Admitting: Cardiovascular Disease

## 2010-07-23 ENCOUNTER — Ambulatory Visit (INDEPENDENT_AMBULATORY_CARE_PROVIDER_SITE_OTHER): Payer: Self-pay | Admitting: Cardiovascular Disease

## 2010-07-23 VITALS — BP 138/90 | HR 72 | Wt 219.0 lb

## 2010-07-23 DIAGNOSIS — I10 Essential (primary) hypertension: Secondary | ICD-10-CM

## 2010-07-23 DIAGNOSIS — I4891 Unspecified atrial fibrillation: Secondary | ICD-10-CM

## 2010-07-23 NOTE — Progress Notes (Signed)
Keith Briggs Date of Birth  Aug 26, 1980 [facility] / [facility] [address]     [address] [place], [place]  [address] [phone]  Fax  [phone]  History of Present Illness:  Keith Briggs was  seen today for followup visit. He was last seen in our office in February.  He's been in jail for the past several months.  He complains of having intermittent episodes of chest pain. These episodes occur at various times. Keith Briggs Are not related to any specific activity.  Current Outpatient Prescriptions on File Prior to Visit  Medication Sig Dispense Refill  . aspirin (BAYER ASPIRIN) 325 MG tablet Take 325 mg by mouth daily.        . hydrochlorothiazide 12.5 MG capsule Take 12.5 mg by mouth daily. For blood pressure       . metoprolol (TOPROL-XL) 100 MG 24 hr tablet Take 1 tablet (100 mg total) by mouth daily.  30 tablet  5    Allergies  Allergen Reactions  . Bee Venom Anaphylaxis    Past Medical History  Diagnosis Date  . HTN (hypertension)   . Chest pain   . Atrial fibrillation     No past surgical history on file.  History  Smoking status  . Never Smoker   Smokeless tobacco  . Not on file    History  Alcohol Use No    No family history on file.  Reviw of Systems:  Reviewed in the HPI.  All other systems are negative.  Physical Exam: BP 138/90  Pulse 72  Wt 219 lb (99.338 kg) The patient is alert and oriented x 3.  The mood and affect are normal.  The skin is warm and dry.  Color is normal.  The HEENT exam reveals that the sclera are nonicteric.  The mucous membranes are moist.  The carotids are 2+ without bruits.  There is no thyromegaly.  There is no JVD.  The lungs are clear.  The chest wall is non tender.  The heart exam reveals an irregular rate with a normal S1 and S2.  There are no murmurs, gallops, or rubs.  The PMI is not displaced.   Abdominal exam reveals good bowel sounds.  There is no guarding or rebound.  There is no  hepatosplenomegaly or tenderness.  There are no masses.  Exam of the legs reveal no clubbing, cyanosis, or edema.  The legs are without rashes.  The distal pulses are intact.  Cranial nerves II - XII are intact.  Motor and sensory functions are intact.  The gait is normal.  Assessment / Plan:

## 2010-07-23 NOTE — Assessment & Plan Note (Signed)
His diastolic blood pressure is mildly elevated today. I've asked him to try getting a little bit more exercise. He has lost weight

## 2010-07-23 NOTE — Assessment & Plan Note (Signed)
Keith Briggs continues to have atrial fibrillation.  He will remain on aspirin 325 mg a day. He's been is asymptomatic and I do not think that we need to consider cardioversion at this time.

## 2010-07-28 ENCOUNTER — Encounter: Payer: Self-pay | Admitting: Cardiovascular Disease

## 2011-03-07 ENCOUNTER — Telehealth: Payer: Self-pay | Admitting: Cardiovascular Disease

## 2011-03-07 NOTE — Telephone Encounter (Signed)
New problem:   Per Alphonza, Can Toprol be change to gentric.

## 2011-03-07 NOTE — Telephone Encounter (Signed)
Called msg/ may be generic fill

## 2011-11-09 IMAGING — CT CT HEAD W/O CM
1 of 2 series · 16 of 30 positions shown, 20 images · non-contrast
Comparison: None

CLINICAL DATA: Left-sided facial paralysis.

CT HEAD WITHOUT CONTRAST
TECHNIQUE: Contiguous axial images were obtained from the base of
the skull through the vertex without contrast.

[Series 2: brain · axial · 0.47mm/px · z∈[+134,+257]mm · 16 of 28 slices shown, 20 images]
[im 2/28  brain]
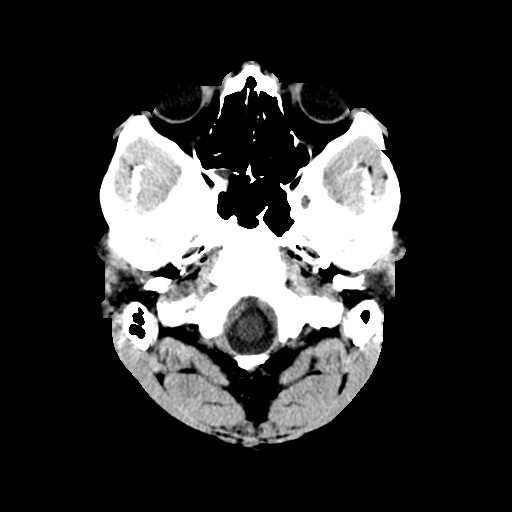
[im 2/28  bone]
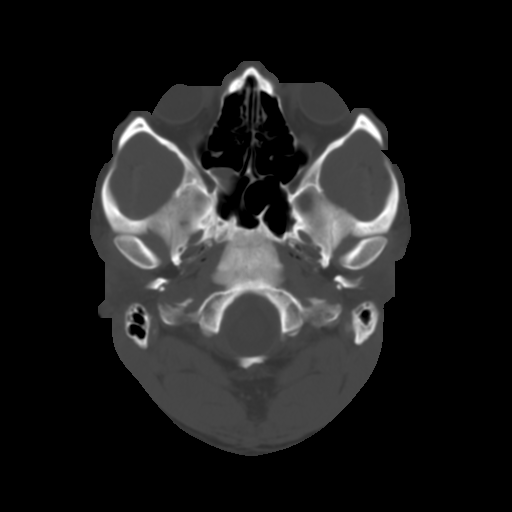
[im 4/28  brain]
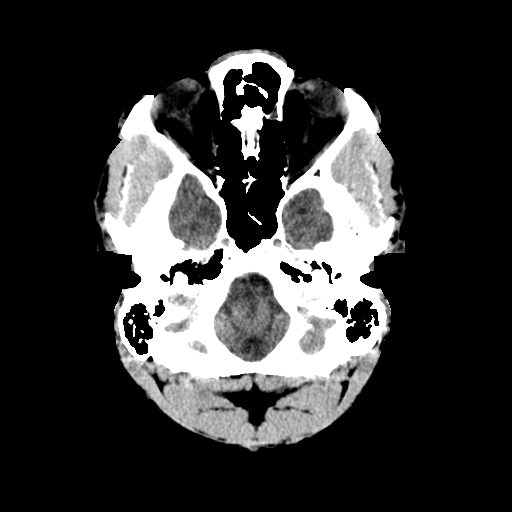
[im 5/28  brain]
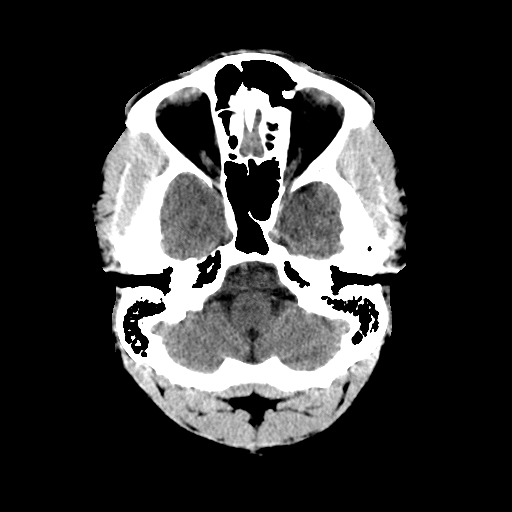
[im 7/28  brain]
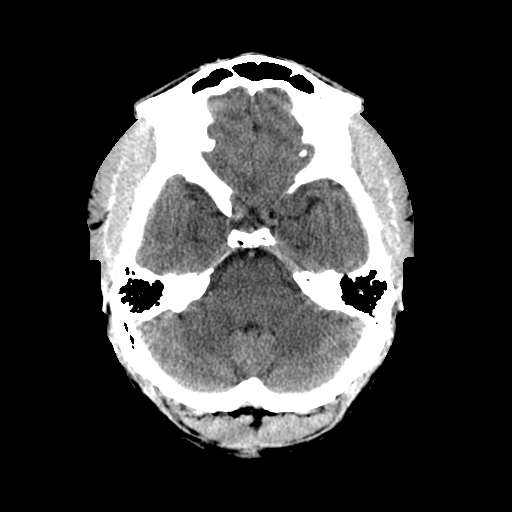
[im 8/28  brain]
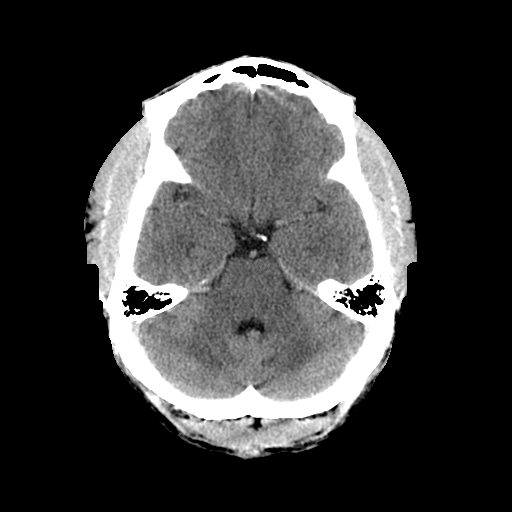
[im 8/28  bone]
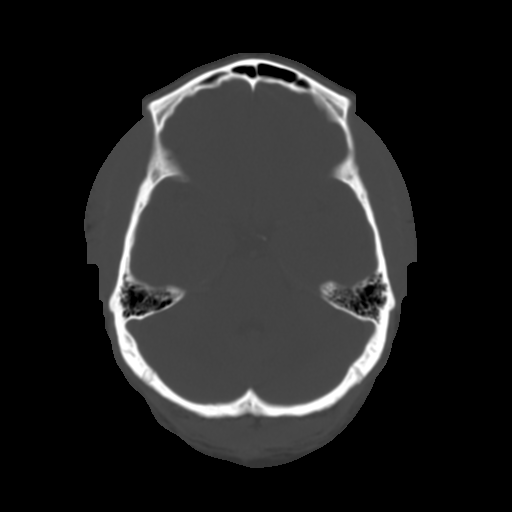
[im 10/28  brain]
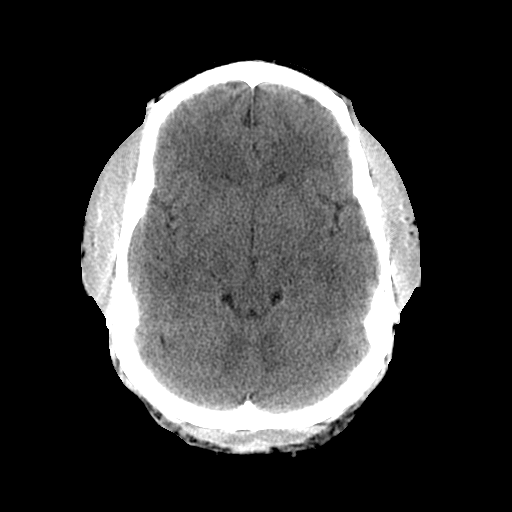
[im 12/28  brain]
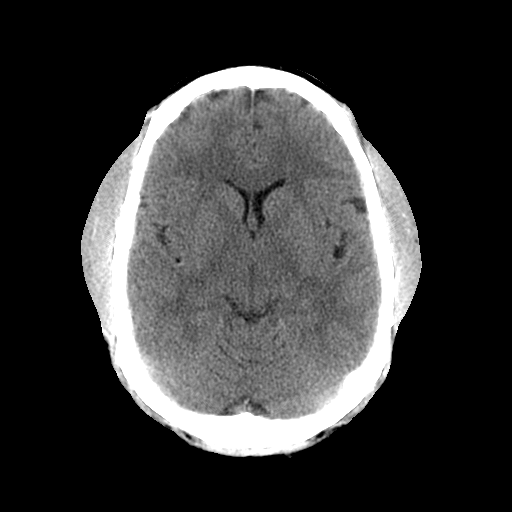
[im 13/28  brain]
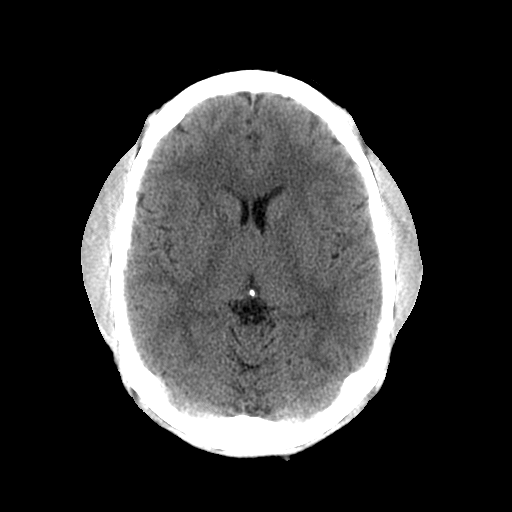
[im 15/28  brain]
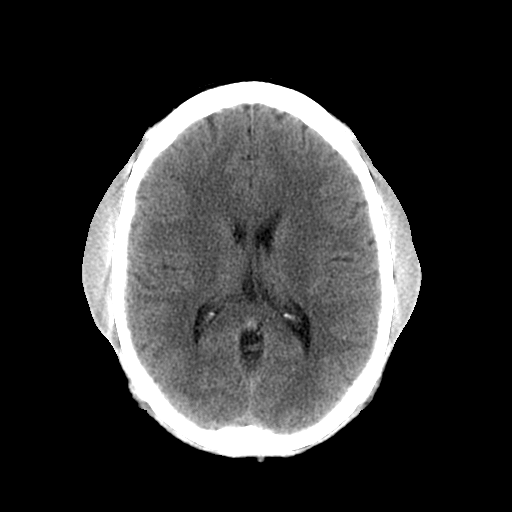
[im 15/28  bone]
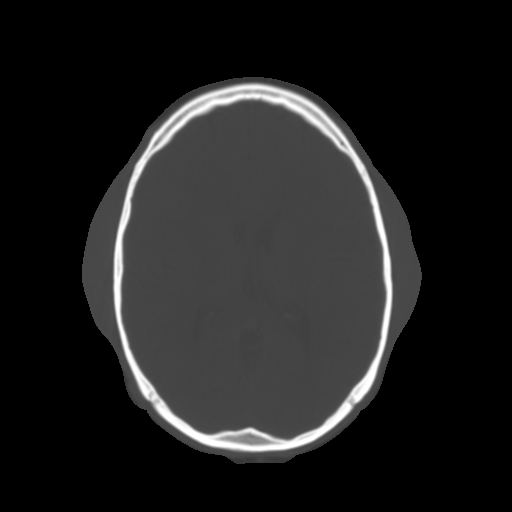
[im 16/28  brain]
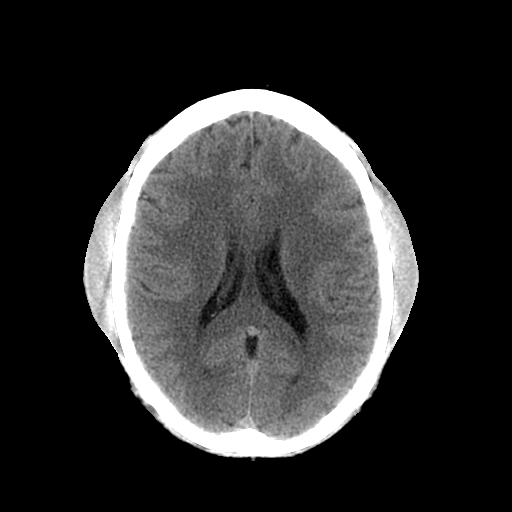
[im 18/28  brain]
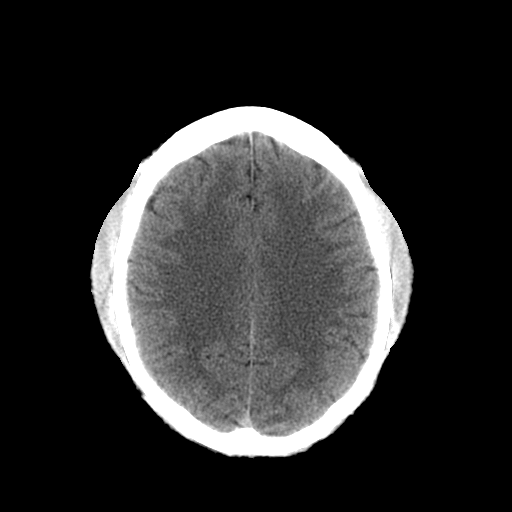
[im 20/28  brain]
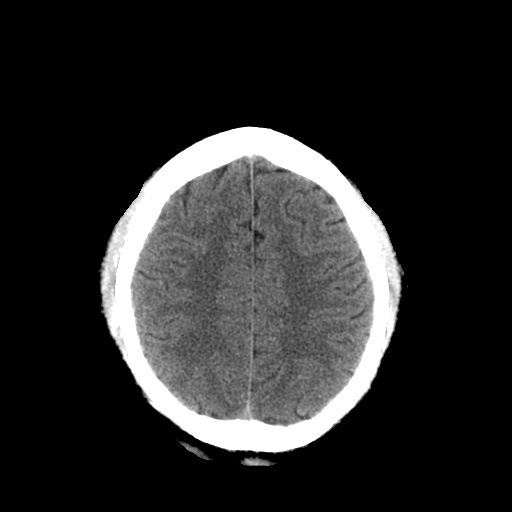
[im 21/28  brain]
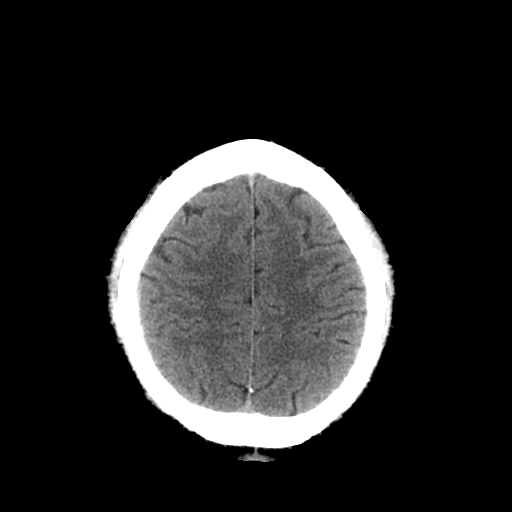
[im 21/28  bone]
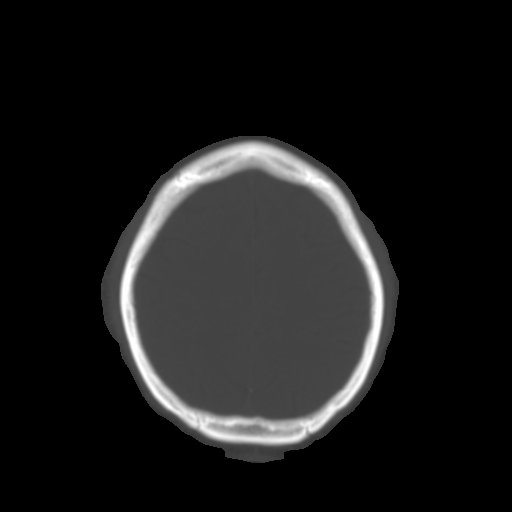
[im 23/28  brain]
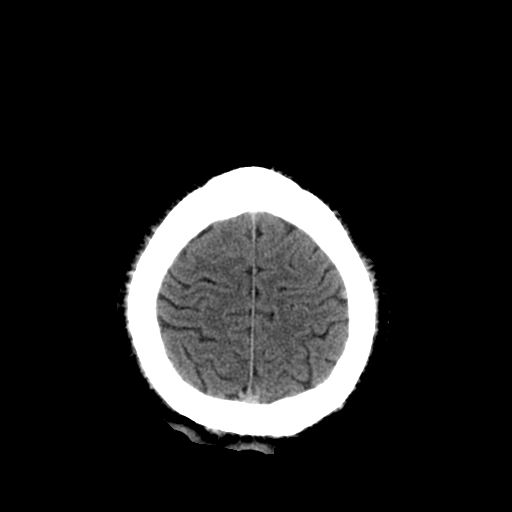
[im 24/28  brain]
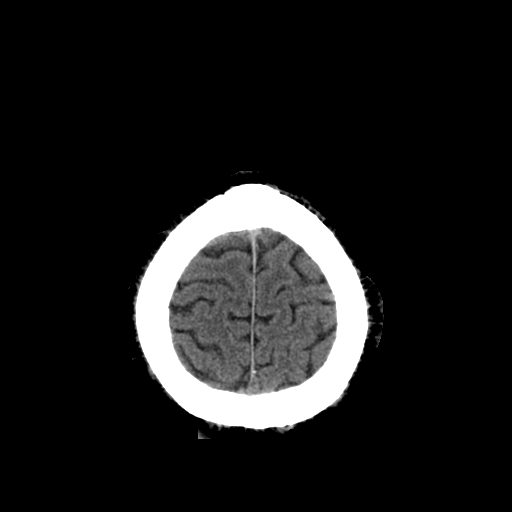
[im 26/28  brain]
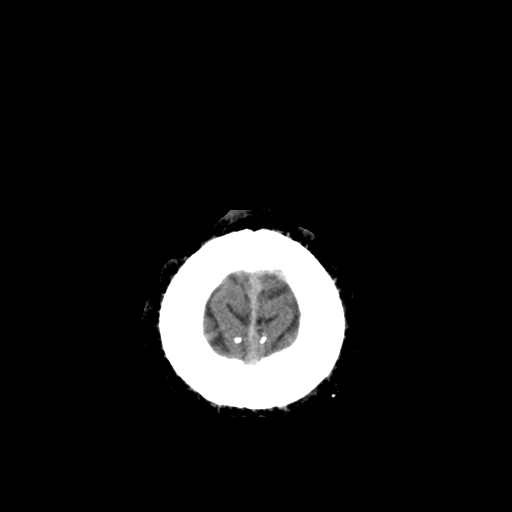

[16 of 30 positions shown; findings below may reference images not displayed]

FINDINGS: No acute intracranial abnormality.  Specifically, no
hemorrhage, hydrocephalus, mass lesion, acute infarction, or
significant intracranial injury.  No acute calvarial abnormality.

Visualized paranasal sinuses and mastoids clear.  Orbital soft
tissues unremarkable.
IMPRESSION: No acute intracranial abnormality.

## 2022-03-23 NOTE — Progress Notes (Signed)
New Patient Office Visit  Subjective    Patient ID: Keith Briggs, male    DOB: [birth year]  Age: 41 y.o. MRN: [id number]  CC:  Chief Complaint  Patient presents with   [place] presents to establish care (last seen in our [facility] clinic in 2011)   Patient has been in prison for the last 12 years.  He denies any respiratory symptoms such as cough, latent TB symptoms.  He denies any drug or alcohol use during prison time or currently.  He states that most of the time in prison he was working out and keeping his strength up.  PMH-HTN-previously on 12.5 mg HCTZ, metoprolol 100 mg daily (been off of them for many years); PAF-aspirin 325 (has not been seen by cards since 2012) Surg-No surgeries Allergies-Bee venom-anaphylaxis, buttermilk-hives, raw tomatoes-rash SH- never smoking, no alcohol use (last used as a teenager), or drug use/IVDU. Work for [employer] there FH-heart disease on both sides, diabetes, HTN, great grandma had a pacemaker  Outpatient Encounter Medications as of 03/25/2022  Medication Sig   aspirin (BAYER ASPIRIN) 325 MG tablet Take 325 mg by mouth daily.     hydrochlorothiazide 12.5 MG capsule Take 12.5 mg by mouth daily. For blood pressure  (Patient not taking: Reported on 03/25/2022)   metoprolol (TOPROL-XL) 100 MG 24 hr tablet Take 1 tablet (100 mg total) by mouth daily. (Patient not taking: Reported on 03/25/2022)   No facility-administered encounter medications on file as of 03/25/2022.    Past Medical History:  Diagnosis Date   Atrial fibrillation ([place])    Chest pain    HTN (hypertension)     No past surgical history on file.  No family history on file.  Social History   Socioeconomic History   Marital status: Divorced    Spouse name: Not on file   Number of children: Not on file   Years of education: Not on file   Highest education level: Not on file  Occupational History   Not on file   Tobacco Use   Smoking status: Never   Smokeless tobacco: Not on file  Substance and Sexual Activity   Alcohol use: No   Drug use: Not on file   Sexual activity: Not on file  Other Topics Concern   Not on file  Social History Narrative   Not on file   Social Determinants of Health   Financial Resource Strain: Not on file  Food Insecurity: Not on file  Transportation Needs: Not on file  Physical Activity: Not on file  Stress: Not on file  Social Connections: Not on file  Intimate Partner Violence: Not on file    Objective    BP 111/70   Pulse 72   Temp 98.4 F (36.9 C)   Ht 6' (1.829 m)   Wt 261 lb 6.4 oz (118.6 kg)   SpO2 98%   BMI 35.45 kg/m   General: Well appearing, NAD, awake, alert, responsive to questions Head: Normocephalic atraumatic CV: Regular rate and rhythm no murmurs rubs or gallops Respiratory: Clear to ausculation bilaterally, no wheezes rales or crackles, chest rises symmetrically,  no increased work of breathing Abdomen: Soft, non-tender, non-distended, normoactive bowel sounds  Extremities: Moves upper and lower extremities freely, no edema in LE Neuro: No focal deficits Skin: No rashes or lesions visualized  Assessment & Plan:   Problem List Items Addressed This Visit       Cardiovascular and  Mediastinum   ATRIAL FIBRILLATION, PAROXYSMAL    Patient previously seen by cardiology and was considered low risk and was put on aspirin 325 and metoprolol daily.  He denies any symptoms of palpitations or chest pain today.  He has been taking his aspirin daily but has not taken his metoprolol for many years.  Is in normal sinus rhythm on exam today.  Discussed with him that CHA2DS2-VASc score is a 1-very low risk. -Discussed aspirin not necessary so could either continue taking 81 mg daily for primary prevention or stop, patient would like to continue aspirin -Monitor, consider Zio patch if patient becomes symptomatic again        Other   History of  hypertension    History of hypertension previously on HCTZ and metoprolol however patient has well-controlled blood pressure today 111/70 with no medication use for years. -Monitor      History of incarceration    For the last 12 years has been incarcerated.  Does not have any respiratory symptoms that would make me think of latent TB.  Denies any IV drug use during this time.  Seems to be doing well as he has a job. -Consider additional screenings in the future       Other Visit Diagnoses     Encounter for screening for other metabolic disorders    -  Primary   Relevant Orders   CBC   Comprehensive metabolic panel     Patient has insurance that will kick in during January and he would like to get his labs during this time as well as vaccinations.  I discussed he could also get vaccines at outpatient pharmacies.  No follow-ups on file.   Levin Erp, MD

## 2022-03-25 ENCOUNTER — Ambulatory Visit (INDEPENDENT_AMBULATORY_CARE_PROVIDER_SITE_OTHER): Payer: Self-pay | Admitting: Student

## 2022-03-25 ENCOUNTER — Encounter: Payer: Self-pay | Admitting: Student

## 2022-03-25 VITALS — BP 111/70 | HR 72 | Temp 98.4°F | Ht 72.0 in | Wt 261.4 lb

## 2022-03-25 DIAGNOSIS — I480 Paroxysmal atrial fibrillation: Secondary | ICD-10-CM

## 2022-03-25 DIAGNOSIS — Z789 Other specified health status: Secondary | ICD-10-CM

## 2022-03-25 DIAGNOSIS — Z13228 Encounter for screening for other metabolic disorders: Secondary | ICD-10-CM

## 2022-03-25 DIAGNOSIS — Z8679 Personal history of other diseases of the circulatory system: Secondary | ICD-10-CM

## 2022-03-25 NOTE — Assessment & Plan Note (Signed)
History of hypertension previously on HCTZ and metoprolol however patient has well-controlled blood pressure today 111/70 with no medication use for years. -Monitor

## 2022-03-25 NOTE — Assessment & Plan Note (Signed)
For the last 12 years has been incarcerated.  Does not have any respiratory symptoms that would make me think of latent TB.  Denies any IV drug use during this time.  Seems to be doing well as he has a job. -Consider additional screenings in the future

## 2022-03-25 NOTE — Patient Instructions (Addendum)
It was great to see you! Thank you for allowing me to participate in your care!   Our plans for today:  -You could either stop taking the aspirin or go down to 81 mg a day given your stroke risk is very low -We will keep an eye on your blood pressure and we will get labs that I have put as a future order -Get your vaccines at the the next visit in 1 month as your insurance will kick in at that time  Take care and seek immediate care sooner if you develop any concerns.  Levin Erp, MD

## 2022-03-25 NOTE — Assessment & Plan Note (Signed)
Patient previously seen by cardiology and was considered low risk and was put on aspirin 325 and metoprolol daily.  He denies any symptoms of palpitations or chest pain today.  He has been taking his aspirin daily but has not taken his metoprolol for many years.  Is in normal sinus rhythm on exam today.  Discussed with him that CHA2DS2-VASc score is a 1-very low risk. -Discussed aspirin not necessary so could either continue taking 81 mg daily for primary prevention or stop, patient would like to continue aspirin -Monitor, consider Zio patch if patient becomes symptomatic again

## 2022-05-04 ENCOUNTER — Encounter: Payer: Self-pay | Admitting: Student

## 2022-05-04 ENCOUNTER — Ambulatory Visit (INDEPENDENT_AMBULATORY_CARE_PROVIDER_SITE_OTHER): Payer: BLUE CROSS/BLUE SHIELD | Admitting: Student

## 2022-05-04 VITALS — BP 134/88 | HR 82 | Ht 72.0 in | Wt 261.5 lb

## 2022-05-04 DIAGNOSIS — Z13228 Encounter for screening for other metabolic disorders: Secondary | ICD-10-CM

## 2022-05-04 DIAGNOSIS — Z23 Encounter for immunization: Secondary | ICD-10-CM | POA: Diagnosis not present

## 2022-05-04 DIAGNOSIS — Z8679 Personal history of other diseases of the circulatory system: Secondary | ICD-10-CM | POA: Diagnosis not present

## 2022-05-04 DIAGNOSIS — Z0000 Encounter for general adult medical examination without abnormal findings: Secondary | ICD-10-CM | POA: Insufficient documentation

## 2022-05-04 LAB — POCT GLYCOSYLATED HEMOGLOBIN (HGB A1C): Hemoglobin A1C: 5.1 % (ref 4.0–5.6)

## 2022-05-04 NOTE — Assessment & Plan Note (Addendum)
Covid, Flu and Tdap given today.  -CBC, A1c -Lipid panel future when fasting

## 2022-05-04 NOTE — Addendum Note (Signed)
Addended by: Salvatore Marvel on: 05/04/2022 10:05 AM   Modules accepted: Orders

## 2022-05-04 NOTE — Patient Instructions (Addendum)
It was great to see you! Thank you for allowing me to participate in your care!   Our plans for today:  - We will get some blood testing today, please come back when available for a lab visit when you are not eating to check your cholesterol  - you got your vaccines today!  Take care and seek immediate care sooner if you develop any concerns.  Gerrit Heck, MD

## 2022-05-04 NOTE — Progress Notes (Signed)
    SUBJECTIVE:   CHIEF COMPLAINT / HPI: Labs and vaccines  Elevated BP 141/76 initially > on recheck was 134/88.  Patient is asymptomatic.  Health Maintenance Patient would like vaccinations today to become up-to-date. Also would like his labs collected today  PERTINENT  PMH / PSH: Hx of PAF  OBJECTIVE:   BP 134/88   Pulse 82   Ht 6' (1.829 m)   Wt 261 lb 8 oz (118.6 kg)   SpO2 97%   BMI 35.47 kg/m   General: Well appearing, NAD, awake, alert, responsive to questions Head: Normocephalic atraumatic CV: Regular rate and rhythm no murmurs rubs or gallops Respiratory: Clear to ausculation bilaterally, no wheezes rales or crackles, chest rises symmetrically,  no increased work of breathing Abdomen: Soft, non-tender, non-distended, normoactive bowel sounds  Extremities: Moves upper and lower extremities freely, no edema in LE Neuro: No focal deficits Skin: No rashes or lesions visualized   ASSESSMENT/PLAN:   History of hypertension Patient initially had elevated blood pressures today but on recheck was normal at 134/88. -Monitor -[place] maintenance Covid, Flu and Tdap given today.  -CBC, A1c -Lipid panel future when fasting   Gerrit Heck, MD [place]

## 2022-05-04 NOTE — Addendum Note (Signed)
Addended by: Gerrit Heck on: 05/04/2022 09:39 AM   Modules accepted: Orders

## 2022-05-04 NOTE — Assessment & Plan Note (Signed)
Patient initially had elevated blood pressures today but on recheck was normal at 134/88. -Monitor -CMP

## 2022-05-05 LAB — CBC
Hematocrit: 49 % (ref 37.5–51.0)
Hemoglobin: 15.5 g/dL (ref 13.0–17.7)
MCH: 27.2 pg (ref 26.6–33.0)
MCHC: 31.6 g/dL (ref 31.5–35.7)
MCV: 86 fL (ref 79–97)
Platelets: 211 10*3/uL (ref 150–450)
RBC: 5.7 x10E6/uL (ref 4.14–5.80)
RDW: 11.7 % (ref 11.6–15.4)
WBC: 4.2 10*3/uL (ref 3.4–10.8)

## 2022-05-05 LAB — COMPREHENSIVE METABOLIC PANEL
ALT: 33 IU/L (ref 0–44)
AST: 25 IU/L (ref 0–40)
Albumin/Globulin Ratio: 1.5 (ref 1.2–2.2)
Albumin: 4.6 g/dL (ref 4.1–5.1)
Alkaline Phosphatase: 54 IU/L (ref 44–121)
BUN/Creatinine Ratio: 13 (ref 9–20)
BUN: 14 mg/dL (ref 6–24)
Bilirubin Total: 0.5 mg/dL (ref 0.0–1.2)
CO2: 22 mmol/L (ref 20–29)
Calcium: 9.8 mg/dL (ref 8.7–10.2)
Chloride: 101 mmol/L (ref 96–106)
Creatinine, Ser: 1.11 mg/dL (ref 0.76–1.27)
Globulin, Total: 3 g/dL (ref 1.5–4.5)
Glucose: 128 mg/dL — ABNORMAL HIGH (ref 70–99)
Potassium: 4.3 mmol/L (ref 3.5–5.2)
Sodium: 139 mmol/L (ref 134–144)
Total Protein: 7.6 g/dL (ref 6.0–8.5)
eGFR: 86 mL/min/{1.73_m2} (ref >59)

## 2022-05-13 ENCOUNTER — Other Ambulatory Visit: Payer: Self-pay | Admitting: Student

## 2022-05-13 DIAGNOSIS — Z8679 Personal history of other diseases of the circulatory system: Secondary | ICD-10-CM

## 2022-05-13 DIAGNOSIS — Z0000 Encounter for general adult medical examination without abnormal findings: Secondary | ICD-10-CM

## 2022-05-20 ENCOUNTER — Other Ambulatory Visit: Payer: BLUE CROSS/BLUE SHIELD

## 2022-05-20 DIAGNOSIS — Z8679 Personal history of other diseases of the circulatory system: Secondary | ICD-10-CM

## 2022-05-21 LAB — LIPID PANEL
Chol/HDL Ratio: 4 ratio (ref 0.0–5.0)
Cholesterol, Total: 159 mg/dL (ref 100–199)
HDL: 40 mg/dL (ref >39)
LDL Chol Calc (NIH): 109 mg/dL — ABNORMAL HIGH (ref 0–99)
Triglycerides: 49 mg/dL (ref 0–149)
VLDL Cholesterol Cal: 10 mg/dL (ref 5–40)

## 2022-05-22 ENCOUNTER — Encounter: Payer: Self-pay | Admitting: Student

## 2022-06-06 ENCOUNTER — Ambulatory Visit (INDEPENDENT_AMBULATORY_CARE_PROVIDER_SITE_OTHER): Payer: BLUE CROSS/BLUE SHIELD | Admitting: Student

## 2022-06-06 ENCOUNTER — Encounter: Payer: Self-pay | Admitting: Student

## 2022-06-06 VITALS — BP 122/80 | HR 88 | Temp 98.8°F | Ht 72.0 in | Wt 259.6 lb

## 2022-06-06 DIAGNOSIS — Z23 Encounter for immunization: Secondary | ICD-10-CM | POA: Diagnosis not present

## 2022-06-06 DIAGNOSIS — Z0000 Encounter for general adult medical examination without abnormal findings: Secondary | ICD-10-CM

## 2022-06-06 NOTE — Patient Instructions (Signed)
It was great to see you! Thank you for allowing me to participate in your care!   Our plans for today:  - Tdap was given today - Please eat more veggies and walk more - labs looked good today -Follow up in 6 months to a year  Take care and seek immediate care sooner if you develop any concerns.  Gerrit Heck, MD

## 2022-06-06 NOTE — Progress Notes (Unsigned)
    SUBJECTIVE:   CHIEF COMPLAINT / HPI: Tdap booster and discuss labs  Patient coming in to discuss labs and get a Tdap shot. He denies any acute issues today.  PERTINENT  PMH / PSH:   OBJECTIVE:   BP 122/80   Pulse 88   Temp 98.8 F (37.1 C)   Ht 6' (1.829 m)   Wt 259 lb 9.6 oz (117.8 kg)   SpO2 96%   BMI 35.21 kg/m   General: Well appearing, NAD, awake, alert, responsive to questions Head: Normocephalic atraumatic Respiratory: chest rises symmetrically,  no increased work of breathing Skin: No rashes or lesions visualized   ASSESSMENT/PLAN:   Healthcare maintenance Tdap given in office. We discussed mildly elevated LDL but ASCVD score low. No indication for statin. We discussed lifestyle modifications in depth. Patient has elevated BMI however does have increased muscle mass due to body building. Discussed including cardio and increasing vegetable consumption. Patient instructed that he should be fine to follow up in a year but can come sooner if needed.   Gerrit Heck, MD [place]

## 2022-06-08 NOTE — Assessment & Plan Note (Signed)
Tdap given in office. We discussed mildly elevated LDL but ASCVD score low. No indication for statin. We discussed lifestyle modifications in depth. Patient has elevated BMI however does have increased muscle mass due to body building. Discussed including cardio and increasing vegetable consumption. Patient instructed that he should be fine to follow up in a year but can come sooner if needed.
